# Patient Record
Sex: Male | Born: 2000 | Race: White | Hispanic: No | Marital: Single
Health system: Southern US, Community
[De-identification: ages and names within clinical notes are randomized; demographics above are authoritative.]

---

## 2022-02-12 ENCOUNTER — Other Ambulatory Visit: Payer: Self-pay

## 2022-02-12 ENCOUNTER — Emergency Department: Payer: BC Managed Care – PPO

## 2022-02-12 ENCOUNTER — Emergency Department
Admission: EM | Admit: 2022-02-12 | Discharge: 2022-02-13 | Disposition: A | Discharge status: Home / Self Care | Payer: BC Managed Care – PPO | Attending: Emergency Medicine | Admitting: Emergency Medicine

## 2022-02-12 DIAGNOSIS — Y9366 Activity, soccer: Secondary | ICD-10-CM | POA: Insufficient documentation

## 2022-02-12 DIAGNOSIS — S9031XA Contusion of right foot, initial encounter: Secondary | ICD-10-CM | POA: Diagnosis not present

## 2022-02-12 DIAGNOSIS — W500XXA Accidental hit or strike by another person, initial encounter: Secondary | ICD-10-CM | POA: Diagnosis not present

## 2022-02-12 DIAGNOSIS — S99921A Unspecified injury of right foot, initial encounter: Secondary | ICD-10-CM | POA: Diagnosis present

## 2022-02-12 MED ORDER — MELOXICAM 15 MG PO TABS: 15.0000 mg | ORAL_TABLET | Freq: Every day | ORAL | 0 refills | Status: AC

## 2022-02-12 MED ORDER — MELOXICAM 7.5 MG PO TABS
15.0000 mg | ORAL_TABLET | Freq: Once | ORAL | Status: DC
  Filled 2022-02-12: qty 2

## 2022-02-12 NOTE — ED Triage Notes (Signed)
Right foot injury to top of foot. Playing soccer 2 days ago and was kicked on top of foot. +CMS. Pain with walking ?

## 2022-02-12 NOTE — ED Provider Notes (Signed)
? ?[facility] ?Provider Note ? ?Patient Contact: 11:19 PM (approximate) ? ? ?History  ? ?Foot Injury ? ? ?HPI ? ?Jason Cunningham is a 21 y.o. male who presents the emergency department complaining of foot pain.  Patient states that he was playing soccer 2 days ago when he and another player both went to get the same ball.  Player kicked the ball and the other individual kicked to the top of his foot.  He is having pain about the fourth and fifth metatarsal region.  He still ambulatory, no open wounds, no other injuries or complaints. ?  ? ? ?Physical Exam  ? ?Triage Vital Signs: ?ED Triage Vitals  ?Enc Vitals Group  ?   BP 02/12/22 2104 111/61  ?   Pulse Rate 02/12/22 2104 95  ?   Resp 02/12/22 2104 18  ?   Temp 02/12/22 2104 97.8 ?F (36.6 ?C)  ?   Temp Source 02/12/22 2104 Oral  ?   SpO2 02/12/22 2104 98 %  ?   Weight 02/12/22 2104 155 lb (70.3 kg)  ?   Height 02/12/22 2104 6\' 2"  (1.88 m)  ?   Head Circumference --   ?   Peak Flow --   ?   Pain Score 02/12/22 2108 5  ?   Pain Loc --   ?   Pain Edu? --   ?   Excl. in GC? --   ? ? ?Most recent vital signs: ?Vitals:  ? 02/12/22 2104  ?BP: 111/61  ?Pulse: 95  ?Resp: 18  ?Temp: 97.8 ?F (36.6 ?C)  ?SpO2: 98%  ? ? ? ?General: Alert and in no acute distress.  ?Cardiovascular:  Good peripheral perfusion ?Respiratory: Normal respiratory effort without tachypnea or retractions. Lungs CTAB.  ?Musculoskeletal: Full range of motion to all extremities.  Visualization of her right foot reveals no obvious signs of trauma with edema, ecchymosis, open wounds.  Patient is tender to palpation over the distal aspect of the fourth and fifth metatarsal without palpable abnormalities.  Full range of motion to the toes are preserved.  Sensation and capillary refill intact. ?Neurologic:  No gross focal neurologic deficits are appreciated.  ?Skin:   No rash noted ?Other: ? ? ?ED Results / Procedures / Treatments  ? ?Labs ?(all labs ordered are listed, but only abnormal  results are displayed) ?Labs Reviewed - No data to display ? ? ?EKG ? ? ? ? ?RADIOLOGY ? ?I personally viewed and evaluated these images as part of my medical decision making, as well as reviewing the written report by the radiologist. ? ?ED Provider Interpretation: No acute osseous abnormality identified on x-ray of the foot. ? ?DG Foot Complete Right ? ?Result Date: 02/12/2022 ?CLINICAL DATA:  Initial evaluation for acute right foot injury, pain with walking. EXAM: RIGHT FOOT COMPLETE - 3+ VIEW COMPARISON:  None. FINDINGS: There is no evidence of fracture or dislocation. There is no evidence of arthropathy or other focal bone abnormality. Soft tissues are unremarkable. IMPRESSION: No acute osseous abnormality about the right foot. Electronically Signed   By: 02/14/2022 M.D.   On: 02/12/2022 21:53   ? ?PROCEDURES: ? ?Critical Care performed: No ? ?Procedures ? ? ?MEDICATIONS ORDERED IN ED: ?Medications  ?meloxicam (MOBIC) tablet 15 mg (has no administration in time range)  ? ? ? ?IMPRESSION / MDM / ASSESSMENT AND PLAN / ED COURSE  ?I reviewed the triage vital signs and the nursing notes. ?             ?               ? ?  Differential diagnosis includes, but is not limited to, foot contusion, fracture of the foot ? ? ?Patient's diagnosis is consistent with foot contusion.  Patient presented to the emergency department after being accidentally kicked in the foot while playing soccer.  He is kicked along the superior aspect of the foot over the fourth and fifth metacarpals.  Imaging is reassuring with no evidence of fracture.  No open wounds.  Patient will have anti-inflammatory for symptom relief.  I discussed the use of a postop shoe but patient prefers not to use same.  At this time he will be discharged with prescription for meloxicam.  Follow-up with primary care or orthopedics if needed.. Patient is given ED precautions to return to the ED for any worsening or new symptoms. ? ? ? ?  ? ? ?FINAL CLINICAL  IMPRESSION(S) / ED DIAGNOSES  ? ?Final diagnoses:  ?Contusion of right foot, initial encounter  ? ? ? ?Rx / DC Orders  ? ?ED Discharge Orders   ? ?      Ordered  ?  meloxicam (MOBIC) 15 MG tablet  Daily       ? 02/12/22 2325  ? ?  ?  ? ?  ? ? ? ?Note:  This document was prepared using Dragon voice recognition software and may include unintentional dictation errors. ?  ?Racheal Patches, PA-C ?02/12/22 2326 ? ?  ?Georga Hacking, MD ?02/13/22 [phone] ? ?

## 2022-02-13 NOTE — ED Notes (Addendum)
Pt encounter complete but when this nurse went into room to give mobic and review discharge paperwork the room was empty. Waited 15 minutes for patient to return before completing discharge. Unable to complete discharge vitals.  ?

## 2023-03-20 IMAGING — CR DG FOOT COMPLETE 3+V*R*
3 series · 3 of 3 positions shown · non-contrast
Comparison: None.

CLINICAL DATA: Initial evaluation for acute right foot injury, pain
with walking.

EXAM:
RIGHT FOOT COMPLETE - 3+ VIEW

[foot ap]
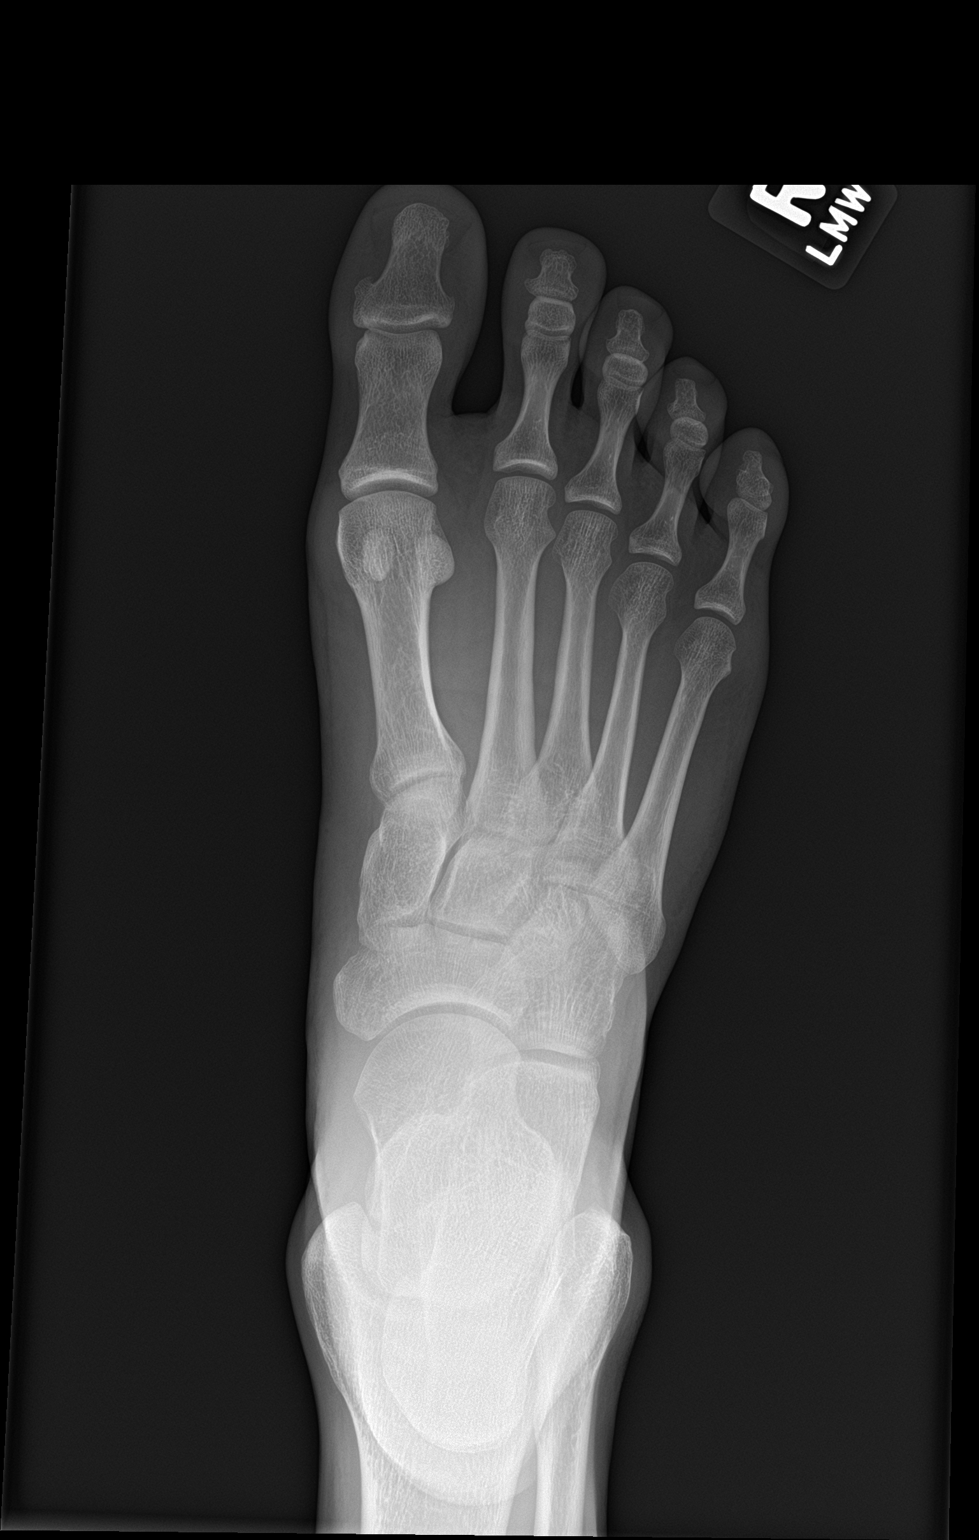

[foot obl]
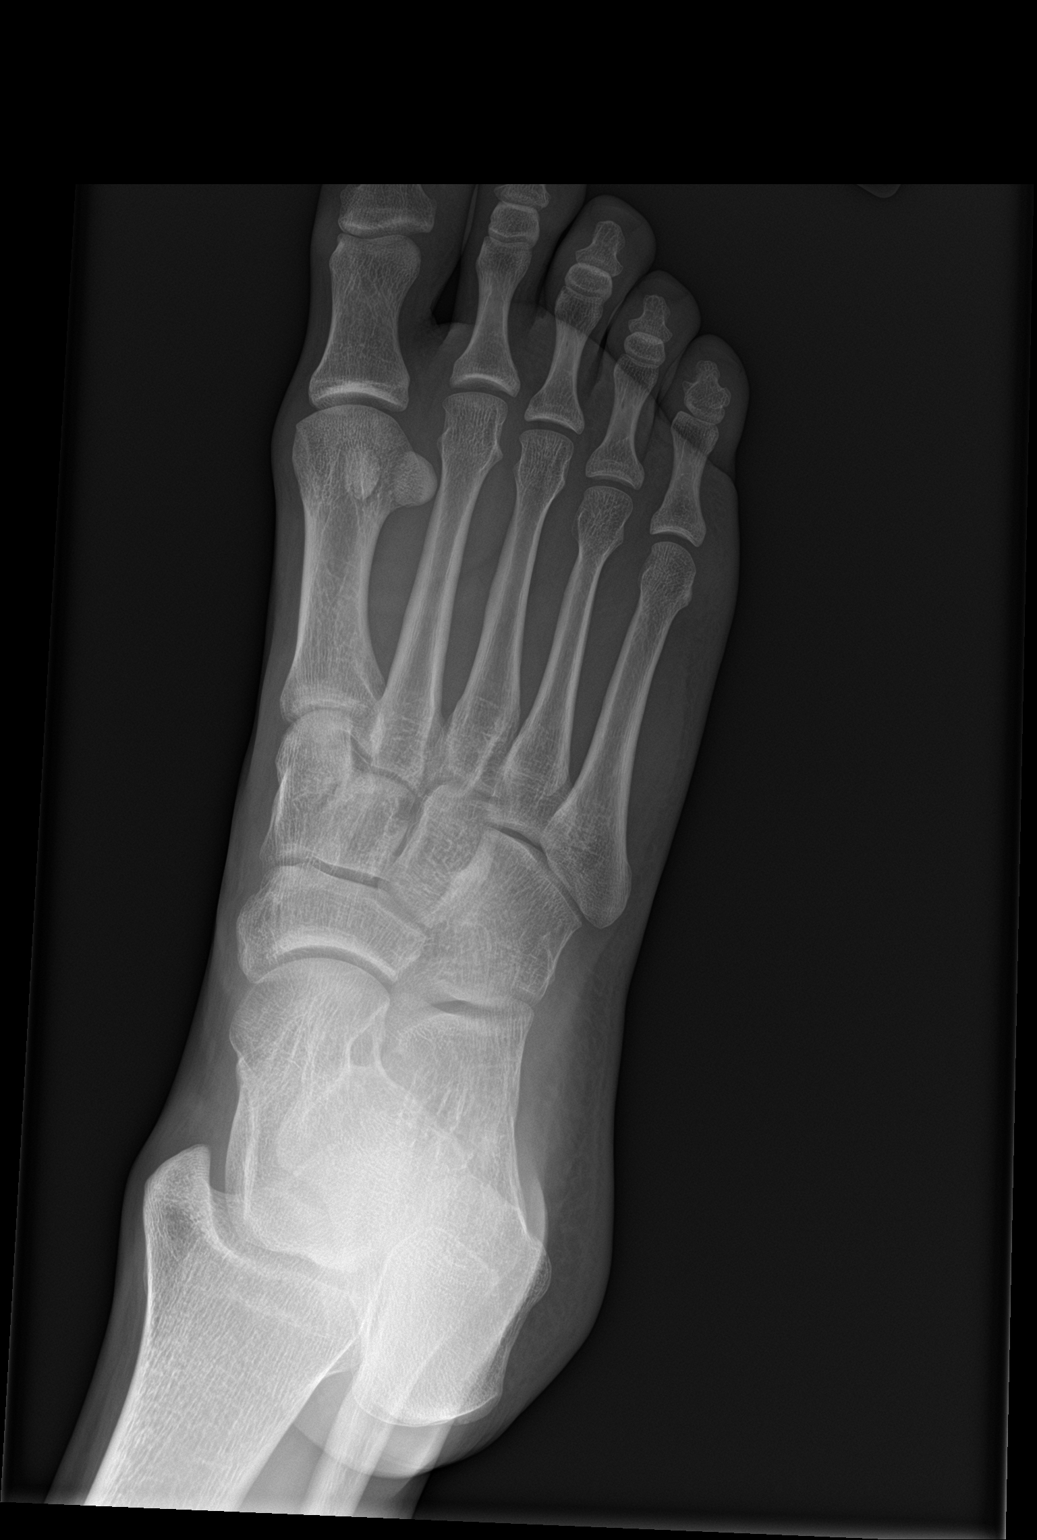

[foot lat]
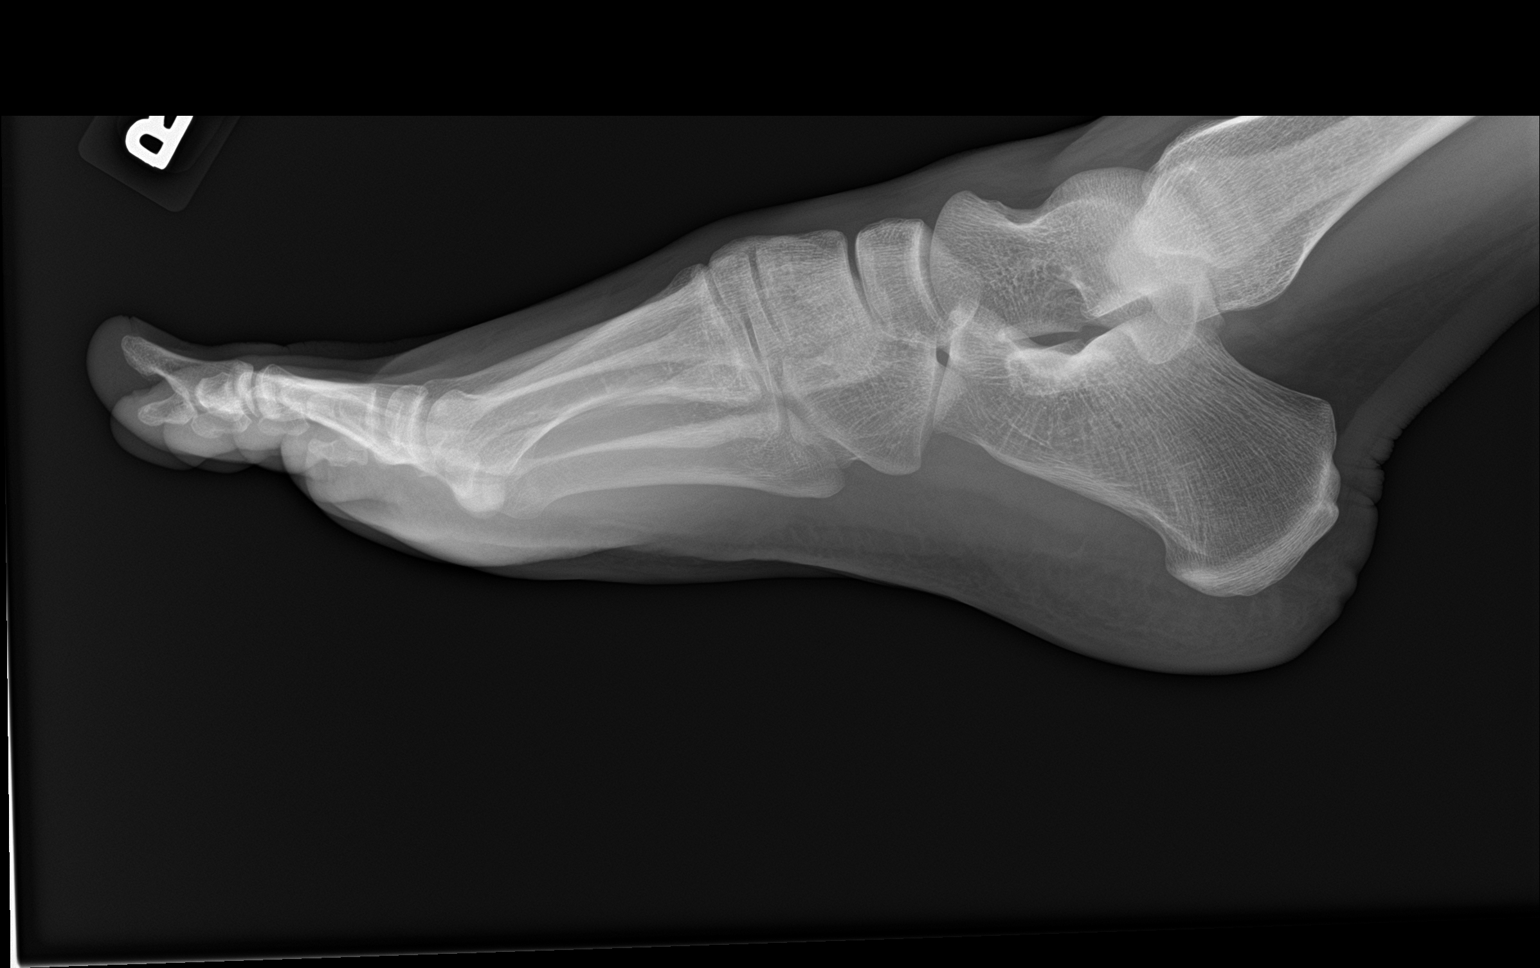

[3 of 3 positions shown; findings below may reference images not displayed]

FINDINGS: There is no evidence of fracture or dislocation. There is no
evidence of arthropathy or other focal bone abnormality. Soft
tissues are unremarkable.
IMPRESSION: No acute osseous abnormality about the right foot.
